# Patient Record
Sex: Female | Born: 1985 | Race: Black or African American | Hispanic: No | Smoking: Current some day smoker
Health system: Southern US, Community
[De-identification: ages and names within clinical notes are randomized; demographics above are authoritative.]

## PROBLEM LIST (undated history)

## (undated) DIAGNOSIS — R7303 Prediabetes: Secondary | ICD-10-CM

## (undated) DIAGNOSIS — O24419 Gestational diabetes mellitus in pregnancy, unspecified control: Secondary | ICD-10-CM

## (undated) HISTORY — PX: TONSILLECTOMY: SUR1361

---

## 2019-07-29 ENCOUNTER — Emergency Department: Payer: PRIVATE HEALTH INSURANCE

## 2019-07-29 ENCOUNTER — Emergency Department
Admission: EM | Admit: 2019-07-29 | Discharge: 2019-07-29 | Disposition: A | Discharge status: Home / Self Care | Payer: PRIVATE HEALTH INSURANCE | Attending: Student in an Organized Health Care Education/Training Program | Admitting: Student in an Organized Health Care Education/Training Program

## 2019-07-29 ENCOUNTER — Other Ambulatory Visit: Payer: Self-pay

## 2019-07-29 DIAGNOSIS — F17200 Nicotine dependence, unspecified, uncomplicated: Secondary | ICD-10-CM | POA: Diagnosis not present

## 2019-07-29 DIAGNOSIS — R0789 Other chest pain: Secondary | ICD-10-CM | POA: Insufficient documentation

## 2019-07-29 DIAGNOSIS — R079 Chest pain, unspecified: Secondary | ICD-10-CM | POA: Diagnosis present

## 2019-07-29 DIAGNOSIS — R7303 Prediabetes: Secondary | ICD-10-CM | POA: Diagnosis not present

## 2019-07-29 DIAGNOSIS — Z8632 Personal history of gestational diabetes: Secondary | ICD-10-CM | POA: Insufficient documentation

## 2019-07-29 HISTORY — DX: Gestational diabetes mellitus in pregnancy, unspecified control: O24.419

## 2019-07-29 HISTORY — DX: Prediabetes: R73.03

## 2019-07-29 LAB — COMPREHENSIVE METABOLIC PANEL
ALT: 15 U/L (ref 0–44)
AST: 13 U/L — ABNORMAL LOW (ref 15–41)
Albumin: 3.5 g/dL (ref 3.5–5.0)
Alkaline Phosphatase: 83 U/L (ref 38–126)
Anion gap: 6 (ref 5–15)
BUN: 14 mg/dL (ref 6–20)
CO2: 25 mmol/L (ref 22–32)
Calcium: 8.7 mg/dL — ABNORMAL LOW (ref 8.9–10.3)
Chloride: 103 mmol/L (ref 98–111)
Creatinine, Ser: 0.62 mg/dL (ref 0.44–1.00)
GFR calc Af Amer: >60 mL/min (ref >60)
GFR calc non Af Amer: >60 mL/min (ref >60)
Glucose, Bld: 133 mg/dL — ABNORMAL HIGH (ref 70–99)
Potassium: 3.9 mmol/L (ref 3.5–5.1)
Sodium: 134 mmol/L — ABNORMAL LOW (ref 135–145)
Total Bilirubin: 0.3 mg/dL (ref 0.3–1.2)
Total Protein: 6.9 g/dL (ref 6.5–8.1)

## 2019-07-29 LAB — CBC
HCT: 41.4 % (ref 36.0–46.0)
Hemoglobin: 13.5 g/dL (ref 12.0–15.0)
MCH: 27.5 pg (ref 26.0–34.0)
MCHC: 32.6 g/dL (ref 30.0–36.0)
MCV: 84.3 fL (ref 80.0–100.0)
Platelets: 225 10*3/uL (ref 150–400)
RBC: 4.91 MIL/uL (ref 3.87–5.11)
RDW: 12.5 % (ref 11.5–15.5)
WBC: 10.8 10*3/uL — ABNORMAL HIGH (ref 4.0–10.5)
nRBC: 0 % (ref 0.0–0.2)

## 2019-07-29 LAB — LIPASE, BLOOD: Lipase: 24 U/L (ref 11–51)

## 2019-07-29 LAB — POC URINE PREG, ED: Preg Test, Ur: NEGATIVE

## 2019-07-29 LAB — TROPONIN I (HIGH SENSITIVITY)
Troponin I (High Sensitivity): 3 ng/L (ref <18)
Troponin I (High Sensitivity): <2 ng/L (ref <18)

## 2019-07-29 MED ORDER — OXYCODONE-ACETAMINOPHEN 5-325 MG PO TABS
1.0000 | ORAL_TABLET | Freq: Once | ORAL | Status: AC
  Administered 2019-07-29: 1 via ORAL
  Filled 2019-07-29: qty 1

## 2019-07-29 MED ORDER — IOHEXOL 350 MG/ML SOLN
100.0000 mL | Freq: Once | INTRAVENOUS | Status: AC | PRN
  Administered 2019-07-29: 100 mL via INTRAVENOUS

## 2019-07-29 MED ORDER — PANTOPRAZOLE SODIUM 20 MG PO TBEC: 20.0000 mg | DELAYED_RELEASE_TABLET | Freq: Every day | ORAL | 1 refills | Status: AC

## 2019-07-29 NOTE — ED Triage Notes (Signed)
Pt arrives to ER POV stating she has "chest complications" points to upper mid chest with back pain between shoulder blades and nausea since 4am. A&O, ambulatory. No cardiac hx. States lots of stress lately.

## 2019-07-29 NOTE — ED Notes (Signed)
E-signature not working at this time. Pt verbalized understanding of D/C instructions, prescriptions and follow up care with no further questions at this time. Pt in NAD and ambulatory at time of D/C.  

## 2019-07-29 NOTE — ED Provider Notes (Signed)
[facility] Emergency Department Provider Note    First MD Initiated Contact with Patient 07/29/19 [phone]     (approximate)  I have reviewed the triage vital signs and the nursing notes.   HISTORY  Chief Complaint Chest Pain    HPI Catherine Reyes is a 34 y.o. female below listed past medical history presents to the ER for evaluation of midsternal nonradiating chest discomfort described as burning and throbbing in nature associated with some shortness of breath.  No pain when taking deep inspiration.  Does also report 2 weeks of back pain that she thinks is related to her bed.  States that she has a history of anxiety and panic that she frequently gets shortness of breath with.  The chest pain however is new.  Denies any abdominal pain is having some nausea.  This discomfort work-up earlier this morning.  Did not have any associated diaphoresis.     Past Medical History:  Diagnosis Date  . Gestational diabetes   . Prediabetes    History reviewed. No pertinent family history. Past Surgical History:  Procedure Laterality Date  . TONSILLECTOMY     There are no problems to display for this patient.     Prior to Admission medications   Medication Sig Start Date End Date Taking? Authorizing Provider  pantoprazole (PROTONIX) 20 MG tablet Take 1 tablet (20 mg total) by mouth daily. 07/29/19 07/28/20  Merlyn Lot, MD    Allergies Patient has no known allergies.    Social History Social History   Tobacco Use  . Smoking status: Current Some Day Smoker  Substance Use Topics  . Alcohol use: Not Currently  . Drug use: Not on file    Review of Systems Patient denies headaches, rhinorrhea, blurry vision, numbness, shortness of breath, chest pain, edema, cough, abdominal pain, nausea, vomiting, diarrhea, dysuria, fevers, rashes or hallucinations unless otherwise stated above in HPI. ____________________________________________   PHYSICAL  EXAM:  VITAL SIGNS: Vitals:   07/29/19 1200 07/29/19 1300  BP: 110/70 107/70  Pulse: 63 64  Resp: 20 20  Temp:    SpO2: 100% 100%    Constitutional: Alert and oriented.  Eyes: Conjunctivae are normal.  Head: Atraumatic. Nose: No congestion/rhinnorhea. Mouth/Throat: Mucous membranes are moist.   Neck: No stridor. Painless ROM.  Cardiovascular: Normal rate, regular rhythm. Grossly normal heart sounds.  Good peripheral circulation. Respiratory: Normal respiratory effort.  No retractions. Lungs CTAB. Gastrointestinal: Soft and nontender. No distention. No abdominal bruits. No CVA tenderness. Genitourinary:  Musculoskeletal: No lower extremity tenderness nor edema.  No joint effusions. Neurologic:  Normal speech and language. No gross focal neurologic deficits are appreciated. No facial droop Skin:  Skin is warm, dry and intact. No rash noted. Psychiatric: tearful and anxious appearing. Speech and behavior are normal.  ____________________________________________   LABS (all labs ordered are listed, but only abnormal results are displayed)  Results for orders placed or performed during the hospital encounter of 07/29/19 (from the past 24 hour(s))  CBC     Status: Abnormal   Collection Time: 07/29/19  8:43 AM  Result Value Ref Range   WBC 10.8 (H) 4.0 - 10.5 K/uL   RBC 4.91 3.87 - 5.11 MIL/uL   Hemoglobin 13.5 12.0 - 15.0 g/dL   HCT 41.4 36.0 - 46.0 %   MCV 84.3 80.0 - 100.0 fL   MCH 27.5 26.0 - 34.0 pg   MCHC 32.6 30.0 - 36.0 g/dL   RDW 12.5 11.5 - 15.5 %  Platelets 225 150 - 400 K/uL   nRBC 0.0 0.0 - 0.2 %  Troponin I (High Sensitivity)     Status: None   Collection Time: 07/29/19  8:43 AM  Result Value Ref Range   Troponin I (High Sensitivity) 3 <18 ng/L  Comprehensive metabolic panel     Status: Abnormal   Collection Time: 07/29/19  8:43 AM  Result Value Ref Range   Sodium 134 (L) 135 - 145 mmol/L   Potassium 3.9 3.5 - 5.1 mmol/L   Chloride 103 98 - 111 mmol/L    CO2 25 22 - 32 mmol/L   Glucose, Bld 133 (H) 70 - 99 mg/dL   BUN 14 6 - 20 mg/dL   Creatinine, Ser 3.79 0.44 - 1.00 mg/dL   Calcium 8.7 (L) 8.9 - 10.3 mg/dL   Total Protein 6.9 6.5 - 8.1 g/dL   Albumin 3.5 3.5 - 5.0 g/dL   AST 13 (L) 15 - 41 U/L   ALT 15 0 - 44 U/L   Alkaline Phosphatase 83 38 - 126 U/L   Total Bilirubin 0.3 0.3 - 1.2 mg/dL   GFR calc non Af Amer >60 >60 mL/min   GFR calc Af Amer >60 >60 mL/min   Anion gap 6 5 - 15  Lipase, blood     Status: None   Collection Time: 07/29/19  8:43 AM  Result Value Ref Range   Lipase 24 11 - 51 U/L  Troponin I (High Sensitivity)     Status: None   Collection Time: 07/29/19 10:32 AM  Result Value Ref Range   Troponin I (High Sensitivity) <2 <18 ng/L  POC urine preg, ED     Status: None   Collection Time: 07/29/19 10:36 AM  Result Value Ref Range   Preg Test, Ur Negative Negative   ____________________________________________  EKG My review and personal interpretation at Time: 8:39   Indication: chest pain  Rate: 85  Rhythm: sinus Axis: normal Other: normal intervals, no stemi ____________________________________________  RADIOLOGY  I personally reviewed all radiographic images ordered to evaluate for the above acute complaints and reviewed radiology reports and findings.  These findings were personally discussed with the patient.  Please see medical record for radiology report.  ____________________________________________   PROCEDURES  Procedure(s) performed:  Procedures    Critical Care performed: no ____________________________________________   INITIAL IMPRESSION / ASSESSMENT AND PLAN / ED COURSE  Pertinent labs & imaging results that were available during my care of the patient were reviewed by me and considered in my medical decision making (see chart for details).   DDX: ACS, pericarditis, esophagitis, boerhaaves, pe, dissection, pna, bronchitis, costochondritis   Catherine Reyes is a 34 y.o. who  presents to the ED with presentation as described above.  Patient nontoxic-appearing protecting her airway.  Does appear anxious but no respiratory distress.  Vital signs are stable.  Her EKG is nonischemic.  Given her age and risk factors will order serial enzymes.  Given her location of discomfort radiating through to her back will order CTA to exclude aortic pathology.  Does not have any wheezing on exam.  Her abdominal exam is soft and benign.  Do not feel like this is pain referred from the abdomen.  Clinical Course as of Jul 28 1357  Tue Jul 29, 2019  1320 Patient reassessed.  Remains well nontoxic-appearing her work-up here has been completely reassuring.  This point I do believe she stable and appropriate for outpatient follow-up.  We discussed the results  of her work-up and signs and symptoms for which he should return to the ER or seek medical attention.  Have discussed with the patient and available family all diagnostics and treatments performed thus far and all questions were answered to the best of my ability. The patient demonstrates understanding and agreement with plan.    [PR]    Clinical Course User Index [PR] Willy Eddy, MD    The patient was evaluated in Emergency Department today for the symptoms described in the history of present illness. He/she was evaluated in the context of the global COVID-19 pandemic, which necessitated consideration that the patient might be at risk for infection with the SARS-CoV-2 virus that causes COVID-19. Institutional protocols and algorithms that pertain to the evaluation of patients at risk for COVID-19 are in a state of rapid change based on information released by regulatory bodies including the CDC and federal and state organizations. These policies and algorithms were followed during the patient's care in the ED.  As part of my medical decision making, I reviewed the following data within the electronic MEDICAL RECORD NUMBER Nursing notes  reviewed and incorporated, Labs reviewed, notes from prior ED visits and [place] Controlled Substance Database   ____________________________________________   FINAL CLINICAL IMPRESSION(S) / ED DIAGNOSES  Final diagnoses:  Atypical chest pain      NEW MEDICATIONS STARTED DURING THIS VISIT:  Discharge Medication List as of 07/29/2019  1:27 PM    START taking these medications   Details  pantoprazole (PROTONIX) 20 MG tablet Take 1 tablet (20 mg total) by mouth daily., Starting Tue 07/29/2019, Until Wed 07/28/2020, Normal         Note:  This document was prepared using Dragon voice recognition software and may include unintentional dictation errors.    Willy Eddy, MD 07/29/19 1359

## 2020-03-06 ENCOUNTER — Ambulatory Visit: Payer: Self-pay | Attending: Internal Medicine

## 2020-03-06 DIAGNOSIS — Z23 Encounter for immunization: Secondary | ICD-10-CM

## 2020-03-06 NOTE — Progress Notes (Signed)
   Covid-19 Vaccination Clinic  Name:  Catherine Reyes    MRN: [id number] DOB: [birth year]  03/06/2020  Ms. Buckle was observed post Covid-19 immunization for 15 minutes without incident. She was provided with Vaccine Information Sheet and instruction to access the V-Safe system.   [name] was instructed to call 911 with any severe reactions post vaccine: Marland Kitchen Difficulty breathing  . Swelling of face and throat  . A fast heartbeat  . A bad rash all over body  . Dizziness and weakness   Immunizations Administered    Name Date Dose VIS Date Route   Pfizer COVID-19 Vaccine 03/06/2020 12:44 PM 0.3 mL 01/14/2020 Intramuscular   Manufacturer: [employer], [employer]   Lot: [id number]   NDC: [id number]

## 2021-04-13 DIAGNOSIS — Z20822 Contact with and (suspected) exposure to covid-19: Secondary | ICD-10-CM | POA: Diagnosis not present

## 2021-04-13 DIAGNOSIS — R0602 Shortness of breath: Secondary | ICD-10-CM | POA: Diagnosis not present

## 2021-04-13 DIAGNOSIS — J069 Acute upper respiratory infection, unspecified: Secondary | ICD-10-CM | POA: Diagnosis not present

## 2021-05-03 DIAGNOSIS — Z23 Encounter for immunization: Secondary | ICD-10-CM | POA: Diagnosis not present

## 2021-05-03 DIAGNOSIS — Z1331 Encounter for screening for depression: Secondary | ICD-10-CM | POA: Diagnosis not present

## 2021-05-03 DIAGNOSIS — Z0184 Encounter for antibody response examination: Secondary | ICD-10-CM | POA: Diagnosis not present

## 2021-05-03 DIAGNOSIS — E119 Type 2 diabetes mellitus without complications: Secondary | ICD-10-CM | POA: Diagnosis not present

## 2021-05-03 DIAGNOSIS — Z0000 Encounter for general adult medical examination without abnormal findings: Secondary | ICD-10-CM | POA: Diagnosis not present

## 2021-05-12 DIAGNOSIS — W010XXA Fall on same level from slipping, tripping and stumbling without subsequent striking against object, initial encounter: Secondary | ICD-10-CM | POA: Diagnosis not present

## 2021-05-12 DIAGNOSIS — S93402A Sprain of unspecified ligament of left ankle, initial encounter: Secondary | ICD-10-CM | POA: Diagnosis not present

## 2021-05-12 DIAGNOSIS — M25572 Pain in left ankle and joints of left foot: Secondary | ICD-10-CM | POA: Diagnosis not present

## 2021-05-12 DIAGNOSIS — R609 Edema, unspecified: Secondary | ICD-10-CM | POA: Diagnosis not present

## 2021-05-12 DIAGNOSIS — M25472 Effusion, left ankle: Secondary | ICD-10-CM | POA: Diagnosis not present

## 2021-05-12 DIAGNOSIS — E119 Type 2 diabetes mellitus without complications: Secondary | ICD-10-CM | POA: Diagnosis not present

## 2021-07-11 DIAGNOSIS — J3089 Other allergic rhinitis: Secondary | ICD-10-CM | POA: Diagnosis not present

## 2021-07-11 DIAGNOSIS — J029 Acute pharyngitis, unspecified: Secondary | ICD-10-CM | POA: Diagnosis not present

## 2021-12-03 IMAGING — CR DG CHEST 2V
2 series · 2 of 2 positions shown · non-contrast
Comparison: None.

CLINICAL DATA: Chest pain

EXAM:
CHEST - 2 VIEW

[chest pa]
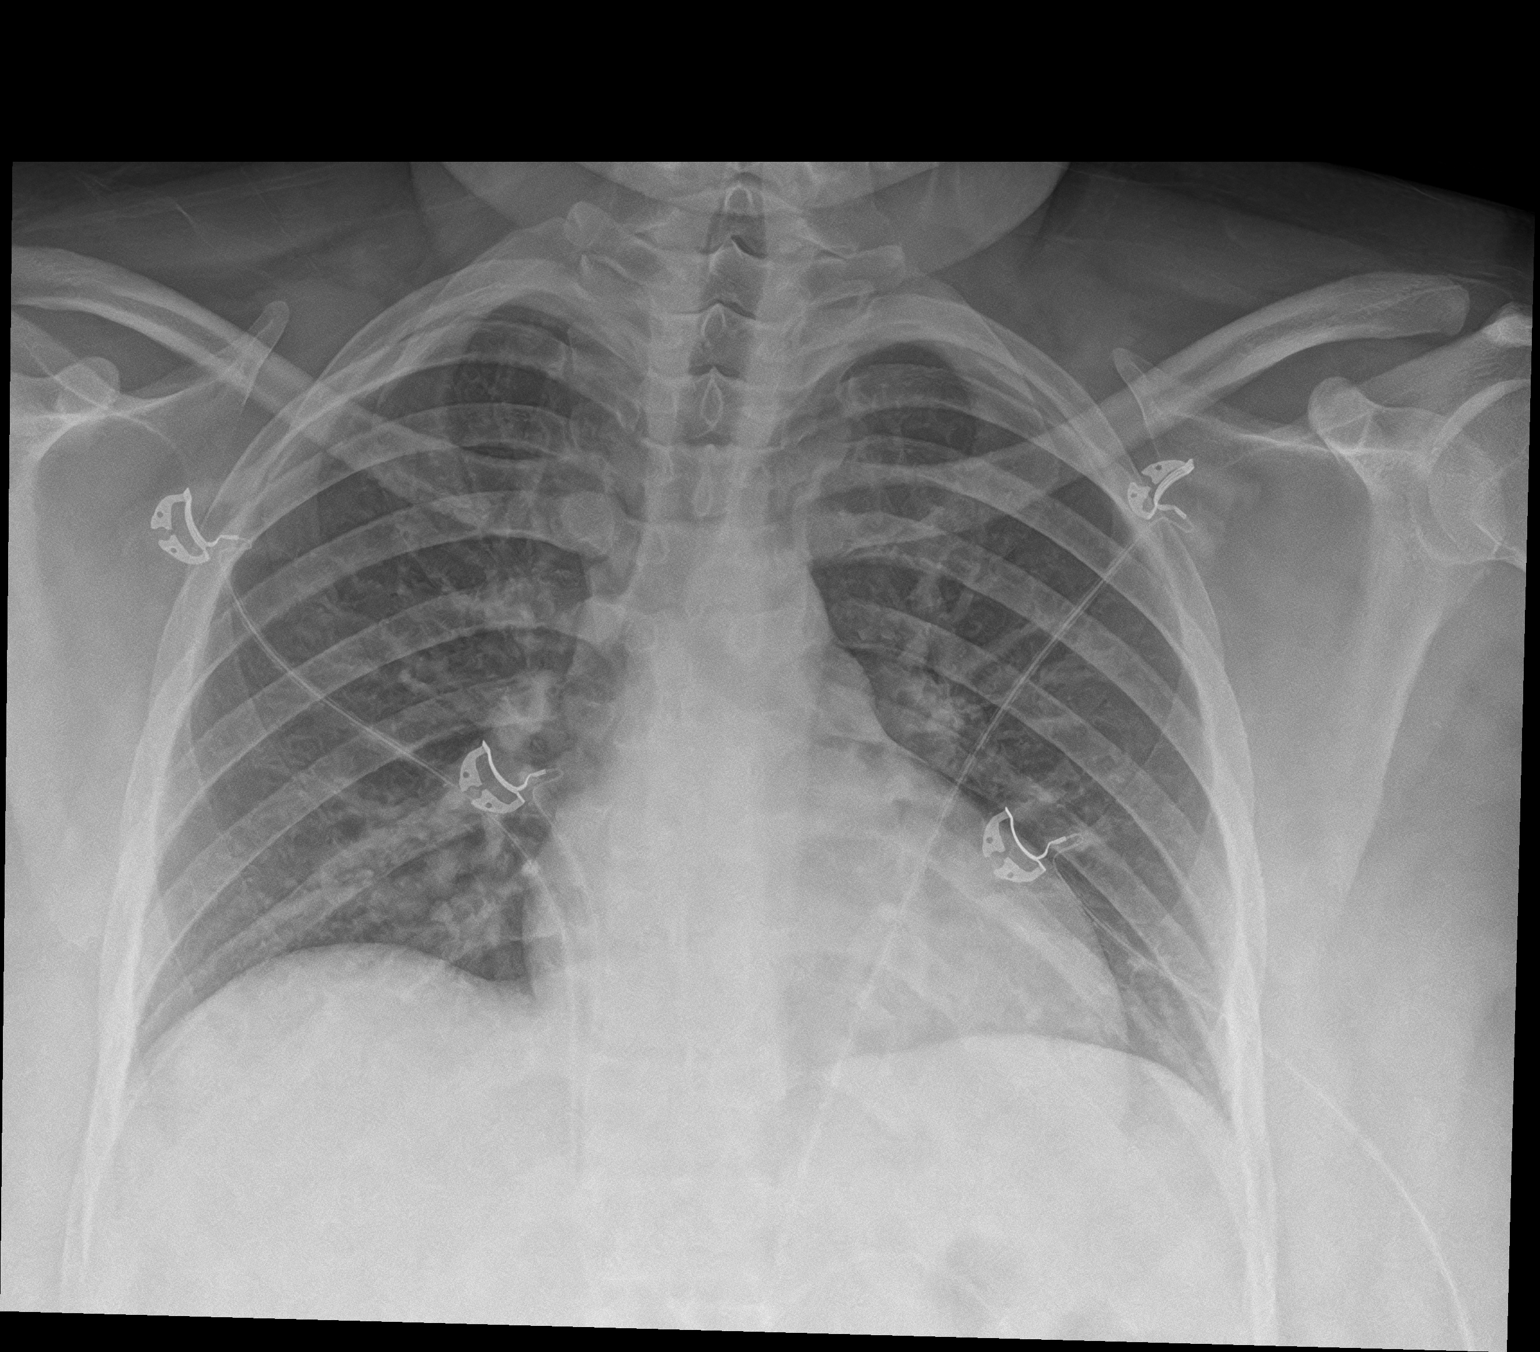

[chest lat]
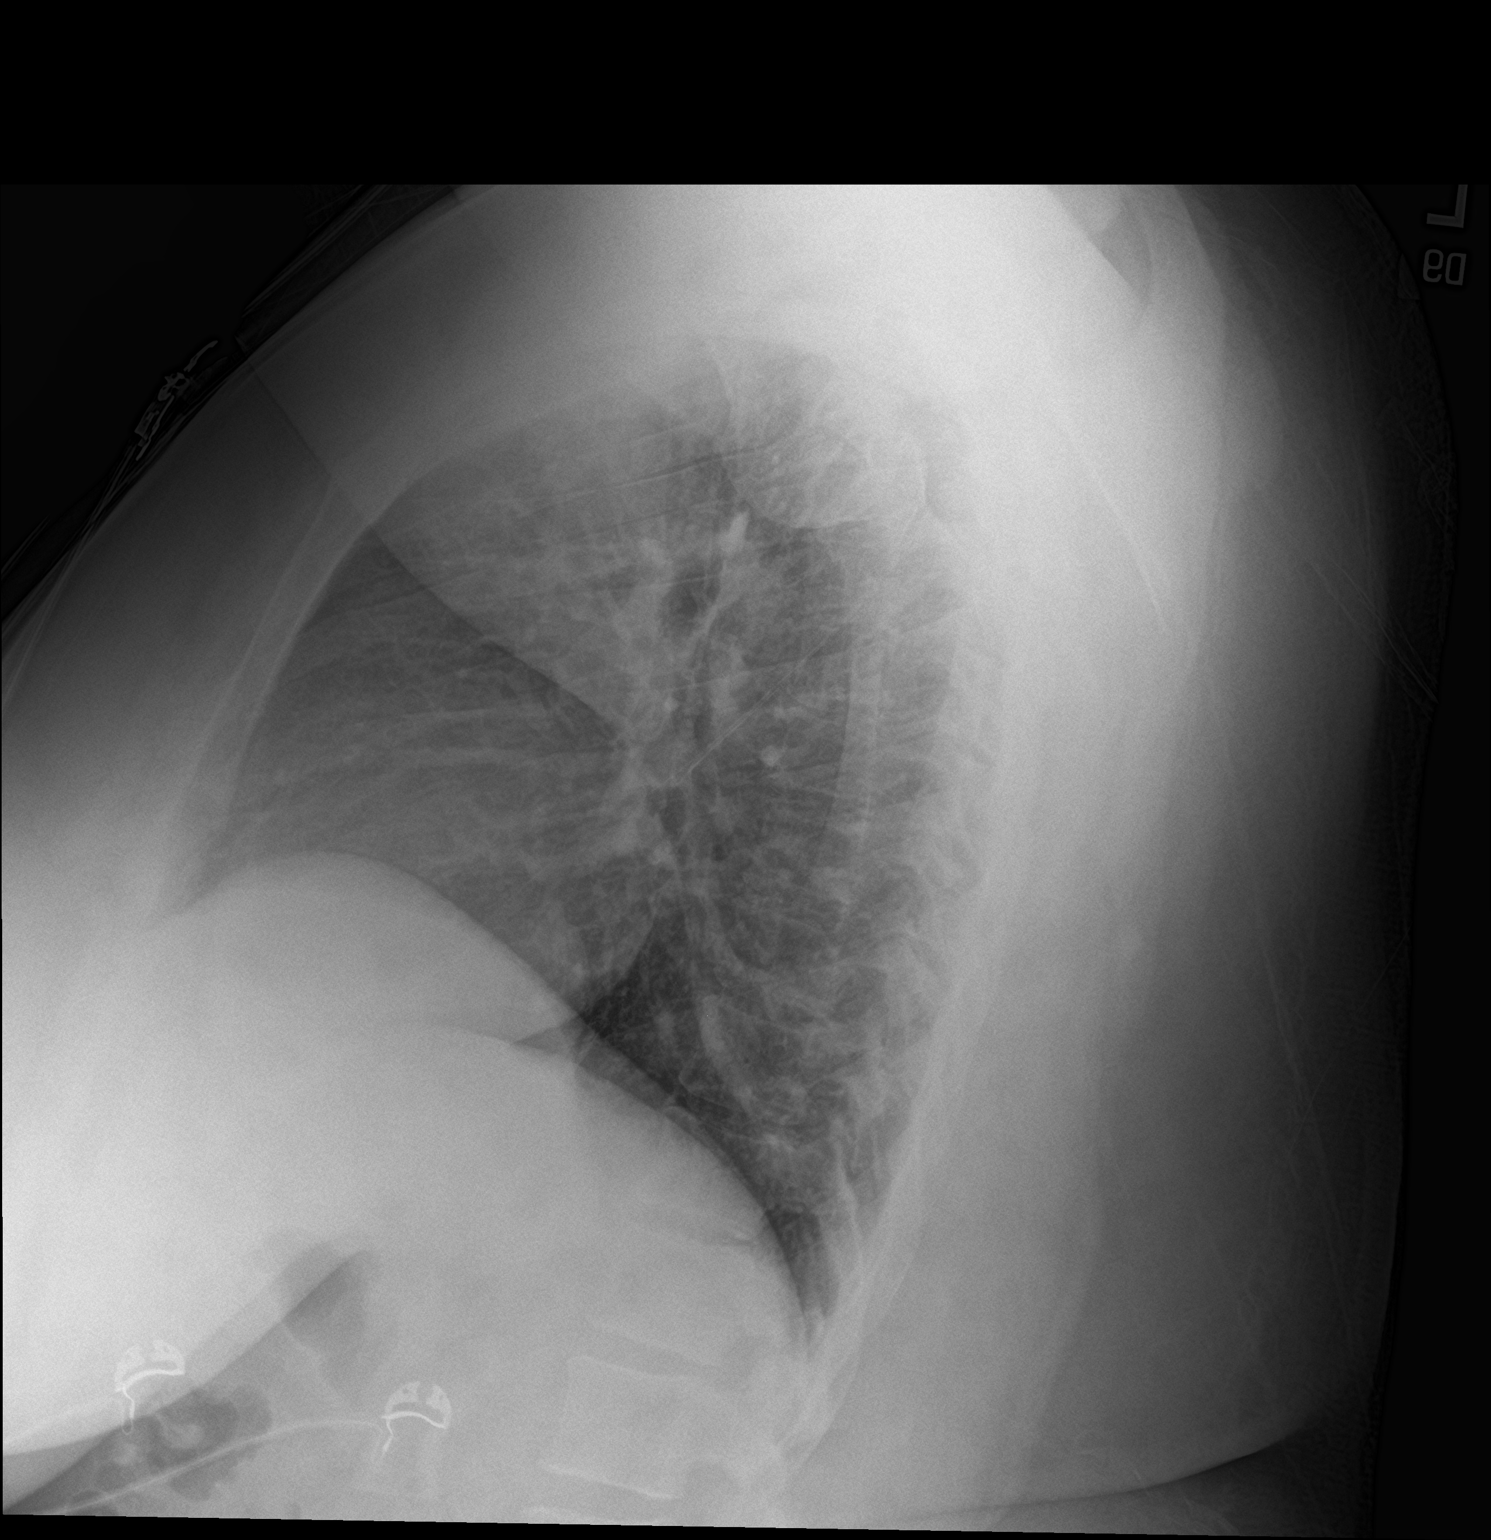

[2 of 2 positions shown; findings below may reference images not displayed]

FINDINGS: Lungs are clear. Heart size and pulmonary vascularity are normal. No
adenopathy. No pneumothorax. No bone lesions.
IMPRESSION: Lungs clear.  Cardiac silhouette within normal limits.

## 2022-04-27 DIAGNOSIS — N979 Female infertility, unspecified: Secondary | ICD-10-CM | POA: Diagnosis not present

## 2022-04-27 DIAGNOSIS — N926 Irregular menstruation, unspecified: Secondary | ICD-10-CM | POA: Diagnosis not present

## 2022-04-27 DIAGNOSIS — E119 Type 2 diabetes mellitus without complications: Secondary | ICD-10-CM | POA: Diagnosis not present

## 2022-05-04 DIAGNOSIS — Z3141 Encounter for fertility testing: Secondary | ICD-10-CM | POA: Diagnosis not present

## 2022-05-15 DIAGNOSIS — N898 Other specified noninflammatory disorders of vagina: Secondary | ICD-10-CM | POA: Diagnosis not present

## 2023-02-12 ENCOUNTER — Emergency Department
Admission: EM | Admit: 2023-02-12 | Discharge: 2023-02-12 | Disposition: A | Discharge status: Home / Self Care | Payer: 59 | Attending: Emergency Medicine | Admitting: Emergency Medicine

## 2023-02-12 ENCOUNTER — Encounter: Payer: Self-pay | Admitting: Emergency Medicine

## 2023-02-12 ENCOUNTER — Emergency Department: Payer: 59

## 2023-02-12 ENCOUNTER — Other Ambulatory Visit: Payer: Self-pay

## 2023-02-12 DIAGNOSIS — J181 Lobar pneumonia, unspecified organism: Secondary | ICD-10-CM | POA: Diagnosis not present

## 2023-02-12 DIAGNOSIS — R0789 Other chest pain: Secondary | ICD-10-CM

## 2023-02-12 DIAGNOSIS — J189 Pneumonia, unspecified organism: Secondary | ICD-10-CM

## 2023-02-12 DIAGNOSIS — R079 Chest pain, unspecified: Secondary | ICD-10-CM | POA: Diagnosis present

## 2023-02-12 LAB — BASIC METABOLIC PANEL
Anion gap: 9 (ref 5–15)
BUN: 11 mg/dL (ref 6–20)
CO2: 26 mmol/L (ref 22–32)
Calcium: 9.1 mg/dL (ref 8.9–10.3)
Chloride: 101 mmol/L (ref 98–111)
Creatinine, Ser: 0.6 mg/dL (ref 0.44–1.00)
GFR, Estimated: >60 mL/min (ref >60)
Glucose, Bld: 183 mg/dL — ABNORMAL HIGH (ref 70–99)
Potassium: 4.2 mmol/L (ref 3.5–5.1)
Sodium: 136 mmol/L (ref 135–145)

## 2023-02-12 LAB — CBC
HCT: 39.9 % (ref 36.0–46.0)
Hemoglobin: 12.5 g/dL (ref 12.0–15.0)
MCH: 26.4 pg (ref 26.0–34.0)
MCHC: 31.3 g/dL (ref 30.0–36.0)
MCV: 84.2 fL (ref 80.0–100.0)
Platelets: 302 10*3/uL (ref 150–400)
RBC: 4.74 MIL/uL (ref 3.87–5.11)
RDW: 14.2 % (ref 11.5–15.5)
WBC: 9.1 10*3/uL (ref 4.0–10.5)
nRBC: 0 % (ref 0.0–0.2)

## 2023-02-12 LAB — TROPONIN I (HIGH SENSITIVITY): Troponin I (High Sensitivity): 3 ng/L (ref <18)

## 2023-02-12 MED ORDER — AMOXICILLIN 500 MG PO CAPS: 1000.0000 mg | ORAL_CAPSULE | Freq: Three times a day (TID) | ORAL | 0 refills | Status: AC

## 2023-02-12 MED ORDER — NAPROXEN 500 MG PO TABS: 500.0000 mg | ORAL_TABLET | Freq: Two times a day (BID) | ORAL | 2 refills | Status: AC

## 2023-02-12 MED ORDER — AZITHROMYCIN 250 MG PO TABS: ORAL_TABLET | ORAL | 0 refills | Status: AC

## 2023-02-12 NOTE — ED Triage Notes (Signed)
Pt here with cp x 1 week. Pt states pain is left sided and radiates to her back. Pt states the pain is intermittent but got stronger this morning. Pt states the pain in her back is also worse as well. Pt alert and oriented and ambulatory to triage.

## 2023-02-12 NOTE — ED Provider Notes (Signed)
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  No history of heart disease.  No calf pain or swelling.  No pleurisy or shortness of breath.  She reports symptoms are relatively mild     Physical Exam   Triage Vital Signs: ED Triage Vitals  Encounter Vitals Group     BP 02/12/23 0805 (!) 145/97     Systolic BP Percentile --      Diastolic BP Percentile --      Pulse Rate 02/12/23 0805 85     Resp 02/12/23 0805 17     Temp 02/12/23 0805 98.3 F (36.8 C)     Temp Source 02/12/23 0805 Oral     SpO2 02/12/23 0805 100 %     Weight 02/12/23 0803 133.8 kg (294 lb 15.6 oz)     Height 02/12/23 0803 1.676 m (5\' 6" )     Head Circumference --      Peak Flow --      Pain Score 02/12/23 0803 7     Pain Loc --      Pain Education --      Exclude from Growth Chart --     Most recent vital signs: Vitals:   02/12/23 0805  BP: (!) 145/97  Pulse: 85  Resp: 17  Temp: 98.3 F (36.8 C)  SpO2: 100%     General: Awake, no distress.  CV:  Good peripheral perfusion.  No chest wall tenderness to palpation Resp:  Normal effort.  Clear to auscultation Abd:  No distention.  Other:  Calf pain or swelling   ED Results / Procedures / Treatments   Labs (all labs ordered are listed, but only abnormal results are displayed) Labs Reviewed  BASIC METABOLIC PANEL - Abnormal; Notable for the following components:      Result Value   Glucose, Bld 183 (*)    All other components within normal limits  CBC  POC URINE PREG, ED  TROPONIN I (HIGH SENSITIVITY)  TROPONIN I (HIGH SENSITIVITY)     EKG  ED ECG REPORT I, Jene Every, the attending physician, personally viewed and interpreted this  ECG.  Date: 02/12/2023  Rhythm: normal sinus rhythm QRS Axis: normal Intervals: normal ST/T Wave abnormalities: normal Narrative Interpretation: no evidence of acute ischemia    RADIOLOGY Chest x-ray viewed interpret by me, questionable infiltrate left upper lobe, confirmed by radiology    PROCEDURES:  Critical Care performed:   Procedures   MEDICATIONS ORDERED IN ED: Medications - No data to display   IMPRESSION / MDM / ASSESSMENT AND PLAN / ED COURSE  I reviewed the triage vital signs and the nursing notes. Patient's presentation is most consistent with acute presentation with potential threat to life or bodily function.  Patient presents with chest pain as detailed above, differential includes ACS, myocarditis, pneumonia, musculoskeletal pain, not consistent with PE, PERC negative  EKG high sensitive troponin are normal, chest x-ray demonstrates possible infiltrate versus atelectasis, infiltrate is in the location of her discomfort, will cover with antibiotics for likely CAP  Outpatient follow-up recommended if no resolution, any worsening she will return to the emergency department  FINAL CLINICAL IMPRESSION(S) / ED DIAGNOSES   Final diagnoses:  Atypical chest pain  Community acquired pneumonia of left upper lobe of lung     Rx / DC Orders   ED Discharge Orders          Ordered    amoxicillin (AMOXIL) 500 MG capsule  3 times daily        02/12/23 0914    azithromycin (ZITHROMAX Z-PAK) 250 MG tablet        02/12/23 0914    naproxen (NAPROSYN) 500 MG tablet  2 times daily with meals        02/12/23 0914             Note:  This document was prepared using Dragon voice recognition software and may include unintentional dictation errors.   Jene Every, MD 02/12/23 [phone]

## 2023-03-13 ENCOUNTER — Emergency Department: Payer: 59

## 2023-03-13 ENCOUNTER — Encounter: Payer: Self-pay | Admitting: Emergency Medicine

## 2023-03-13 ENCOUNTER — Emergency Department
Admission: EM | Admit: 2023-03-13 | Discharge: 2023-03-13 | Disposition: A | Discharge status: Home / Self Care | Payer: 59 | Attending: Emergency Medicine | Admitting: Emergency Medicine

## 2023-03-13 ENCOUNTER — Other Ambulatory Visit: Payer: Self-pay

## 2023-03-13 DIAGNOSIS — R079 Chest pain, unspecified: Secondary | ICD-10-CM | POA: Diagnosis present

## 2023-03-13 DIAGNOSIS — E119 Type 2 diabetes mellitus without complications: Secondary | ICD-10-CM | POA: Diagnosis not present

## 2023-03-13 DIAGNOSIS — R0602 Shortness of breath: Secondary | ICD-10-CM | POA: Diagnosis not present

## 2023-03-13 DIAGNOSIS — R110 Nausea: Secondary | ICD-10-CM | POA: Diagnosis not present

## 2023-03-13 LAB — CBC
HCT: 37.4 % (ref 36.0–46.0)
Hemoglobin: 11.8 g/dL — ABNORMAL LOW (ref 12.0–15.0)
MCH: 26 pg (ref 26.0–34.0)
MCHC: 31.6 g/dL (ref 30.0–36.0)
MCV: 82.4 fL (ref 80.0–100.0)
Platelets: 255 10*3/uL (ref 150–400)
RBC: 4.54 MIL/uL (ref 3.87–5.11)
RDW: 14.5 % (ref 11.5–15.5)
WBC: 9.5 10*3/uL (ref 4.0–10.5)
nRBC: 0 % (ref 0.0–0.2)

## 2023-03-13 LAB — BASIC METABOLIC PANEL
Anion gap: 8 (ref 5–15)
BUN: 14 mg/dL (ref 6–20)
CO2: 24 mmol/L (ref 22–32)
Calcium: 8.6 mg/dL — ABNORMAL LOW (ref 8.9–10.3)
Chloride: 102 mmol/L (ref 98–111)
Creatinine, Ser: 0.72 mg/dL (ref 0.44–1.00)
GFR, Estimated: >60 mL/min (ref >60)
Glucose, Bld: 221 mg/dL — ABNORMAL HIGH (ref 70–99)
Potassium: 4.1 mmol/L (ref 3.5–5.1)
Sodium: 134 mmol/L — ABNORMAL LOW (ref 135–145)

## 2023-03-13 LAB — TROPONIN I (HIGH SENSITIVITY)
Troponin I (High Sensitivity): 2 ng/L (ref <18)
Troponin I (High Sensitivity): 3 ng/L (ref <18)

## 2023-03-13 NOTE — ED Triage Notes (Signed)
Pt here with cp that started last night. Pt states pain was left sided and radiated up her shoulder. Pt states she was SOB this morning as well. Pt states pain is sharp in nature and sometimes tight. Pt endorses nausea. Pt has hx of DM.

## 2023-03-13 NOTE — ED Notes (Signed)
See triage note  Presents with some chest pain which started last pm  States pain increases with movement  No fever or cough

## 2023-03-13 NOTE — Discharge Instructions (Signed)
Please seek medical attention for any high fevers, chest pain, shortness of breath, change in behavior, persistent vomiting, bloody stool or any other new or concerning symptoms.  

## 2023-03-13 NOTE — ED Provider Notes (Signed)
[facility] Provider Note    Event Date/Time   First MD Initiated Contact with Patient 03/13/23 1121     (approximate)   History   Chest Pain   HPI  Catherine Reyes is a 37 y.o. female presents to the emergency department today because of concerns for chest pain.  Patient first noticed some discomfort last night.  It was located in the left side of her chest.  This morning as she had about a 32nd episode where the pain became more severe.  It was sharp.  She denies any significant shortness of breath.  States that she does have history of anxiety and has a surgery coming up for fibroid so wonders if she was worried about that.  She denies any recent travel.  She is not on any birth control.     Physical Exam   Triage Vital Signs: ED Triage Vitals  Encounter Vitals Group     BP 03/13/23 0908 118/80     Systolic BP Percentile --      Diastolic BP Percentile --      Pulse Rate 03/13/23 0908 63     Resp 03/13/23 0908 17     Temp 03/13/23 0908 98.2 F (36.8 C)     Temp Source 03/13/23 0908 Oral     SpO2 03/13/23 0908 100 %     Weight --      Height --      Head Circumference --      Peak Flow --      Pain Score 03/13/23 0909 9     Pain Loc --      Pain Education --      Exclude from Growth Chart --     Most recent vital signs: Vitals:   03/13/23 0908  BP: 118/80  Pulse: 63  Resp: 17  Temp: 98.2 F (36.8 C)  SpO2: 100%   General: Awake, alert, oriented. CV:  Good peripheral perfusion. Regular rate and rhythm. Resp:  Normal effort. Lungs clear. Abd:  No distention. Non tender.  ED Results / Procedures / Treatments   Labs (all labs ordered are listed, but only abnormal results are displayed) Labs Reviewed  BASIC METABOLIC PANEL - Abnormal; Notable for the following components:      Result Value   Sodium 134 (*)    Glucose, Bld 221 (*)    Calcium 8.6 (*)    All other components within normal limits  CBC - Abnormal; Notable for  the following components:   Hemoglobin 11.8 (*)    All other components within normal limits  POC URINE PREG, ED  TROPONIN I (HIGH SENSITIVITY)  TROPONIN I (HIGH SENSITIVITY)     EKG  I, Phineas Semen, attending physician, personally viewed and interpreted this EKG  EKG Time: 0905 Rate: 65 Rhythm: sinus rhythm Axis: normal Intervals: qtc 380 QRS: narrow ST changes: no st elevation Impression: normal ekg   RADIOLOGY I independently interpreted and visualized the CXR. My interpretation: Left sided pneumonia Radiology interpretation: IMPRESSION:  No active cardiopulmonary disease.     PROCEDURES:  Critical Care performed: No  MEDICATIONS ORDERED IN ED: Medications - No data to display   IMPRESSION / MDM / ASSESSMENT AND PLAN / ED COURSE  I reviewed the triage vital signs and the nursing notes.                              Differential  diagnosis includes, but is not limited to, ACS, pneumonia, PTX, PE, esophagitis, anxiety  Patient's presentation is most consistent with acute presentation with potential threat to life or bodily function.   Patient presented to the emergency department today because of concerns for left sided chest pain.  On exam patient's heart and lungs without any concerning auscultatory findings.  EKG without ST elevation.  Troponin negative x 2.  At this time I have low suspicion for PE or dissection.  Given reassuring workup I think is reasonable for patient be discharged.  Do wonder if she could be suffering from anxiety or possibly esophagitis.  Did recommend patient try antiacids for a couple of days.  Did discuss return precautions.      FINAL CLINICAL IMPRESSION(S) / ED DIAGNOSES   Final diagnoses:  Nonspecific chest pain    Note:  This document was prepared using Dragon voice recognition software and may include unintentional dictation errors.    Phineas Semen, MD 03/13/23 [phone]

## 2023-08-31 ENCOUNTER — Emergency Department: Payer: Self-pay

## 2023-08-31 ENCOUNTER — Other Ambulatory Visit: Payer: Self-pay

## 2023-08-31 ENCOUNTER — Emergency Department
Admission: EM | Admit: 2023-08-31 | Discharge: 2023-08-31 | Disposition: A | Discharge status: Home / Self Care | Payer: Self-pay | Attending: Emergency Medicine | Admitting: Emergency Medicine

## 2023-08-31 DIAGNOSIS — K429 Umbilical hernia without obstruction or gangrene: Secondary | ICD-10-CM | POA: Insufficient documentation

## 2023-08-31 DIAGNOSIS — E119 Type 2 diabetes mellitus without complications: Secondary | ICD-10-CM | POA: Insufficient documentation

## 2023-08-31 LAB — URINALYSIS, ROUTINE W REFLEX MICROSCOPIC
Bacteria, UA: NONE SEEN
Bilirubin Urine: NEGATIVE
Glucose, UA: NEGATIVE mg/dL
Ketones, ur: NEGATIVE mg/dL
Nitrite: NEGATIVE
Protein, ur: NEGATIVE mg/dL
Specific Gravity, Urine: 1.016 (ref 1.005–1.030)
pH: 7 (ref 5.0–8.0)

## 2023-08-31 LAB — COMPREHENSIVE METABOLIC PANEL WITH GFR
ALT: 15 U/L (ref 0–44)
AST: 19 U/L (ref 15–41)
Albumin: 3.4 g/dL — ABNORMAL LOW (ref 3.5–5.0)
Alkaline Phosphatase: 66 U/L (ref 38–126)
Anion gap: 10 (ref 5–15)
BUN: 12 mg/dL (ref 6–20)
CO2: 21 mmol/L — ABNORMAL LOW (ref 22–32)
Calcium: 8.9 mg/dL (ref 8.9–10.3)
Chloride: 106 mmol/L (ref 98–111)
Creatinine, Ser: 0.74 mg/dL (ref 0.44–1.00)
GFR, Estimated: >60 mL/min (ref >60)
Glucose, Bld: 157 mg/dL — ABNORMAL HIGH (ref 70–99)
Potassium: 3.6 mmol/L (ref 3.5–5.1)
Sodium: 137 mmol/L (ref 135–145)
Total Bilirubin: 0.4 mg/dL (ref 0.0–1.2)
Total Protein: 6.8 g/dL (ref 6.5–8.1)

## 2023-08-31 LAB — CBC
HCT: 37.9 % (ref 36.0–46.0)
Hemoglobin: 11.5 g/dL — ABNORMAL LOW (ref 12.0–15.0)
MCH: 24.9 pg — ABNORMAL LOW (ref 26.0–34.0)
MCHC: 30.3 g/dL (ref 30.0–36.0)
MCV: 82.2 fL (ref 80.0–100.0)
Platelets: 189 10*3/uL (ref 150–400)
RBC: 4.61 MIL/uL (ref 3.87–5.11)
RDW: 15.6 % — ABNORMAL HIGH (ref 11.5–15.5)
WBC: 8.6 10*3/uL (ref 4.0–10.5)
nRBC: 0 % (ref 0.0–0.2)

## 2023-08-31 LAB — LIPASE, BLOOD: Lipase: 28 U/L (ref 11–51)

## 2023-08-31 LAB — POC URINE PREG, ED: Preg Test, Ur: NEGATIVE

## 2023-08-31 MED ORDER — IOHEXOL 300 MG/ML  SOLN
100.0000 mL | Freq: Once | INTRAMUSCULAR | Status: AC | PRN
  Administered 2023-08-31: 100 mL via INTRAVENOUS

## 2023-08-31 MED ORDER — MORPHINE SULFATE (PF) 4 MG/ML IV SOLN
4.0000 mg | Freq: Once | INTRAVENOUS | Status: AC
  Administered 2023-08-31: 4 mg via INTRAVENOUS
  Filled 2023-08-31: qty 1

## 2023-08-31 MED ORDER — HYDROCODONE-ACETAMINOPHEN 5-325 MG PO TABS: 1.0000 | ORAL_TABLET | Freq: Four times a day (QID) | ORAL | 0 refills | Status: AC | PRN

## 2023-08-31 MED ORDER — ONDANSETRON HCL 4 MG/2ML IJ SOLN
4.0000 mg | Freq: Once | INTRAMUSCULAR | Status: AC
  Administered 2023-08-31: 4 mg via INTRAVENOUS
  Filled 2023-08-31: qty 2

## 2023-08-31 MED ORDER — SODIUM CHLORIDE 0.9 % IV BOLUS
500.0000 mL | Freq: Once | INTRAVENOUS | Status: AC
  Administered 2023-08-31: 500 mL via INTRAVENOUS

## 2023-08-31 NOTE — ED Provider Notes (Signed)
 [facility] Provider Note    Event Date/Time   First MD Initiated Contact with Patient 08/31/23 1658     (approximate)   History   Abdominal Pain   HPI  Catherine Reyes is a 38 y.o. female with history of diabetes presents emergency department complaining of abdominal pain.  Patient states abdominal pain started this morning after sneezing very hard.  Caused her to have umbilical pain 9.5 out of 10.  She has a history of fibroids and had some removed back in December.  States the pain in the mid abdomen has continued.  She still has her gallbladder and appendix.  No vomiting.  No diarrhea.  Last bowel movement was yesterday and was normal.  No dark tarry stools.  Denies fever or chills.  Patient recently started her menstrual cycle.      Physical Exam   Triage Vital Signs: ED Triage Vitals [08/31/23 1622]  Encounter Vitals Group     BP (!) 157/99     Systolic BP Percentile      Diastolic BP Percentile      Pulse Rate 78     Resp 20     Temp 98 F (36.7 C)     Temp Source Oral     SpO2 98 %     Weight      Height      Head Circumference      Peak Flow      Pain Score 9     Pain Loc      Pain Education      Exclude from Growth Chart     Most recent vital signs: Vitals:   08/31/23 1622 08/31/23 2000  BP: (!) 157/99 (!) 127/91  Pulse: 78 72  Resp: 20 16  Temp: 98 F (36.7 C) 98.6 F (37 C)  SpO2: 98% 100%     General: Awake, no distress.   CV:  Good peripheral perfusion. regular rate and  rhythm Resp:  Normal effort. Lungs cta Abd:  No distention.  Tender in the umbilicus area, no bruising noted, no rebound tenderness Other:      ED Results / Procedures / Treatments   Labs (all labs ordered are listed, but only abnormal results are displayed) Labs Reviewed  COMPREHENSIVE METABOLIC PANEL WITH GFR - Abnormal; Notable for the following components:      Result Value   CO2 21 (*)    Glucose, Bld 157 (*)    Albumin 3.4 (*)     All other components within normal limits  CBC - Abnormal; Notable for the following components:   Hemoglobin 11.5 (*)    MCH 24.9 (*)    RDW 15.6 (*)    All other components within normal limits  URINALYSIS, ROUTINE W REFLEX MICROSCOPIC - Abnormal; Notable for the following components:   Color, Urine YELLOW (*)    APPearance CLEAR (*)    Hgb urine dipstick SMALL (*)    Leukocytes,Ua SMALL (*)    All other components within normal limits  POC URINE PREG, ED - Normal  LIPASE, BLOOD     EKG     RADIOLOGY CT abdomen pelvis with IV contrast    PROCEDURES:   Procedures  Critical Care:  no Chief Complaint  Patient presents with   Abdominal Pain      MEDICATIONS ORDERED IN ED: Medications  iohexol  (OMNIPAQUE ) 300 MG/ML solution 100 mL (100 mLs Intravenous Contrast Given 08/31/23 1834)  morphine  (PF) 4 MG/ML  injection 4 mg (4 mg Intravenous Given 08/31/23 1920)  ondansetron  (ZOFRAN ) injection 4 mg (4 mg Intravenous Given 08/31/23 1920)  sodium chloride  0.9 % bolus 500 mL (0 mLs Intravenous Stopped 08/31/23 2000)     IMPRESSION / MDM / ASSESSMENT AND PLAN / ED COURSE  I reviewed the triage vital signs and the nursing notes.                              Differential diagnosis includes, but is not limited to, hernia, acute appendicitis, pancreatitis, acute cholecystitis, bowel obstruction  Patient's presentation is most consistent with acute illness / injury with system symptoms.   Cardiac monitor no Medications given: Morphine  4 mg IV, Zofran  4 mg IV, normal saline 1 L IV  Labs and imaging ordered  Labs are reassuring  CT abdomen pelvis IV contrast independently reviewed interpreted by me by reviewing images and radiologist report.  Radiologist comments that there is a fat-containing umbilical hernia.  This is in the area of concern.  No bowel is noted to be in the umbilicus hernia.   Due to the patient's area of tenderness, we will go ahead and give her pain  medication while here in the ED.  She is given prescription for pain medication.  She is to use a stool softener while taking pain medication.  Follow-up with her regular doctor or a surgeon if she continues to have umbilical pain in 3 to 4 days.  Patient is in agreement with this treatment plan.  She was discharged stable condition.      FINAL CLINICAL IMPRESSION(S) / ED DIAGNOSES   Final diagnoses:  Umbilical hernia without obstruction and without gangrene     Rx / DC Orders   ED Discharge Orders          Ordered    HYDROcodone -acetaminophen  (NORCO/VICODIN) 5-325 MG tablet  Every 6 hours PRN        08/31/23 1914             Note:  This document was prepared using Dragon voice recognition software and may include unintentional dictation errors.    Delsie Figures, PA-C 08/31/23 2007    Bradler, Evan K, MD 09/01/23 2106

## 2023-08-31 NOTE — ED Triage Notes (Signed)
 Pt to Ed via POV from home. Pt reports sneezed and started to have umbilical pain that is 9.5/10. Hx of fibroids. Pain has been constant. Pt also reports taking 800mg  per day x3wks for dental pain. The day before yesterday pt took 1800mg  of ibuprofen.

## 2023-08-31 NOTE — Discharge Instructions (Signed)
 The hernia is fat containing and does not have any bowel involvement so surgery is not required Pain medication as needed If taking the narcotic pain medication use miralax 1 capful to prevent constipation Do not drive or operate machinery while taking the narcotic pain medication

## 2023-08-31 NOTE — ED Notes (Signed)
 ED Provider at bedside.

## 2023-08-31 NOTE — ED Notes (Signed)
 Pt made aware of a urine sample being needed. Unable to void at this time.
